# Patient Record
Sex: Male | Born: 1975 | Race: White | Hispanic: No | Marital: Married | Smoking: Never smoker
Health system: Southern US, Community
[De-identification: ages and names within clinical notes are randomized; demographics above are authoritative.]

## PROBLEM LIST (undated history)

## (undated) DIAGNOSIS — Z789 Other specified health status: Secondary | ICD-10-CM

## (undated) DIAGNOSIS — Z8489 Family history of other specified conditions: Secondary | ICD-10-CM

## (undated) HISTORY — PX: FOOT SURGERY: SHX648

---

## 2014-01-22 ENCOUNTER — Encounter (HOSPITAL_COMMUNITY): Payer: Self-pay | Admitting: Emergency Medicine

## 2014-01-22 ENCOUNTER — Inpatient Hospital Stay (HOSPITAL_COMMUNITY)
Admission: EM | Admit: 2014-01-22 | Discharge: 2014-01-28 | DRG: 516 | Disposition: A | Discharge status: Home / Self Care | Payer: 59 | Attending: Orthopedic Surgery | Admitting: Orthopedic Surgery

## 2014-01-22 ENCOUNTER — Observation Stay (HOSPITAL_COMMUNITY): Payer: 59

## 2014-01-22 ENCOUNTER — Emergency Department (HOSPITAL_COMMUNITY): Payer: 59

## 2014-01-22 DIAGNOSIS — S81809A Unspecified open wound, unspecified lower leg, initial encounter: Secondary | ICD-10-CM

## 2014-01-22 DIAGNOSIS — D72829 Elevated white blood cell count, unspecified: Secondary | ICD-10-CM | POA: Diagnosis present

## 2014-01-22 DIAGNOSIS — IMO0002 Reserved for concepts with insufficient information to code with codable children: Secondary | ICD-10-CM | POA: Diagnosis present

## 2014-01-22 DIAGNOSIS — S32409A Unspecified fracture of unspecified acetabulum, initial encounter for closed fracture: Principal | ICD-10-CM | POA: Diagnosis present

## 2014-01-22 DIAGNOSIS — S81009A Unspecified open wound, unspecified knee, initial encounter: Secondary | ICD-10-CM | POA: Diagnosis present

## 2014-01-22 DIAGNOSIS — K5900 Constipation, unspecified: Secondary | ICD-10-CM | POA: Diagnosis not present

## 2014-01-22 DIAGNOSIS — D62 Acute posthemorrhagic anemia: Secondary | ICD-10-CM | POA: Diagnosis not present

## 2014-01-22 DIAGNOSIS — S91009A Unspecified open wound, unspecified ankle, initial encounter: Secondary | ICD-10-CM

## 2014-01-22 DIAGNOSIS — S32402A Unspecified fracture of left acetabulum, initial encounter for closed fracture: Secondary | ICD-10-CM

## 2014-01-22 DIAGNOSIS — Z531 Procedure and treatment not carried out because of patient's decision for reasons of belief and group pressure: Secondary | ICD-10-CM

## 2014-01-22 HISTORY — DX: Other specified health status: Z78.9

## 2014-01-22 HISTORY — DX: Family history of other specified conditions: Z84.89

## 2014-01-22 LAB — COMPREHENSIVE METABOLIC PANEL
ALBUMIN: 4 g/dL (ref 3.5–5.2)
ALBUMIN: 4 g/dL (ref 3.5–5.2)
ALK PHOS: 47 U/L (ref 39–117)
ALT: 92 U/L — ABNORMAL HIGH (ref 0–53)
ALT: 86 U/L — ABNORMAL HIGH (ref 0–53)
AST: 111 U/L — ABNORMAL HIGH (ref 0–37)
AST: 99 U/L — AB (ref 0–37)
Alkaline Phosphatase: 49 U/L (ref 39–117)
BILIRUBIN TOTAL: 0.5 mg/dL (ref 0.3–1.2)
BUN: 12 mg/dL (ref 6–23)
BUN: 11 mg/dL (ref 6–23)
CHLORIDE: 101 meq/L (ref 96–112)
CO2: 24 mEq/L (ref 19–32)
CO2: 26 mEq/L (ref 19–32)
CREATININE: 0.78 mg/dL (ref 0.50–1.35)
Calcium: 9 mg/dL (ref 8.4–10.5)
Calcium: 8.8 mg/dL (ref 8.4–10.5)
Chloride: 99 mEq/L (ref 96–112)
Creatinine, Ser: 0.72 mg/dL (ref 0.50–1.35)
GFR calc Af Amer: >90 mL/min (ref >90)
GFR calc Af Amer: >90 mL/min (ref >90)
GFR calc non Af Amer: >90 mL/min (ref >90)
GFR calc non Af Amer: >90 mL/min (ref >90)
GLUCOSE: 115 mg/dL — AB (ref 70–99)
Glucose, Bld: 107 mg/dL — ABNORMAL HIGH (ref 70–99)
POTASSIUM: 3.5 meq/L — AB (ref 3.7–5.3)
Potassium: 3.3 mEq/L — ABNORMAL LOW (ref 3.7–5.3)
Sodium: 141 mEq/L (ref 137–147)
Sodium: 139 mEq/L (ref 137–147)
TOTAL PROTEIN: 6.9 g/dL (ref 6.0–8.3)
Total Bilirubin: 0.6 mg/dL (ref 0.3–1.2)
Total Protein: 7 g/dL (ref 6.0–8.3)

## 2014-01-22 LAB — CBC
HCT: 39.6 % (ref 39.0–52.0)
HEMOGLOBIN: 13.7 g/dL (ref 13.0–17.0)
MCH: 31.2 pg (ref 26.0–34.0)
MCHC: 34.6 g/dL (ref 30.0–36.0)
MCV: 90.2 fL (ref 78.0–100.0)
Platelets: 192 10*3/uL (ref 150–400)
RBC: 4.39 MIL/uL (ref 4.22–5.81)
RDW: 12.5 % (ref 11.5–15.5)
WBC: 20.5 10*3/uL — ABNORMAL HIGH (ref 4.0–10.5)

## 2014-01-22 LAB — CBC WITH DIFFERENTIAL/PLATELET
BASOS ABS: 0 10*3/uL (ref 0.0–0.1)
Basophils Relative: 0 % (ref 0–1)
Eosinophils Absolute: 0 10*3/uL (ref 0.0–0.7)
Eosinophils Relative: 0 % (ref 0–5)
HEMATOCRIT: 39.6 % (ref 39.0–52.0)
Hemoglobin: 14.1 g/dL (ref 13.0–17.0)
LYMPHS ABS: 1.8 10*3/uL (ref 0.7–4.0)
Lymphocytes Relative: 7 % — ABNORMAL LOW (ref 12–46)
MCH: 32 pg (ref 26.0–34.0)
MCHC: 35.6 g/dL (ref 30.0–36.0)
MCV: 89.8 fL (ref 78.0–100.0)
MONO ABS: 0.5 10*3/uL (ref 0.1–1.0)
Monocytes Relative: 2 % — ABNORMAL LOW (ref 3–12)
NEUTROS ABS: 22.9 10*3/uL — AB (ref 1.7–7.7)
Neutrophils Relative %: 91 % — ABNORMAL HIGH (ref 43–77)
Platelets: 216 10*3/uL (ref 150–400)
RBC: 4.41 MIL/uL (ref 4.22–5.81)
RDW: 12.6 % (ref 11.5–15.5)
WBC: 25.2 10*3/uL — AB (ref 4.0–10.5)

## 2014-01-22 MED ORDER — METHOCARBAMOL 500 MG PO TABS
500.0000 mg | ORAL_TABLET | Freq: Four times a day (QID) | ORAL | Status: DC | PRN
  Administered 2014-01-23: 500 mg via ORAL
  Filled 2014-01-22: qty 1

## 2014-01-22 MED ORDER — METHOCARBAMOL 1000 MG/10ML IJ SOLN
500.0000 mg | Freq: Four times a day (QID) | INTRAVENOUS | Status: DC | PRN
  Filled 2014-01-22: qty 5

## 2014-01-22 MED ORDER — IOHEXOL 300 MG/ML  SOLN
100.0000 mL | Freq: Once | INTRAMUSCULAR | Status: AC | PRN
  Administered 2014-01-22: 100 mL via INTRAVENOUS

## 2014-01-22 MED ORDER — SODIUM CHLORIDE 0.9 % IV SOLN
INTRAVENOUS | Status: DC
  Administered 2014-01-23: via INTRAVENOUS

## 2014-01-22 MED ORDER — ONDANSETRON HCL 4 MG/2ML IJ SOLN
4.0000 mg | Freq: Once | INTRAMUSCULAR | Status: AC
Start: 2014-01-22 — End: 2014-01-22
  Administered 2014-01-22: 4 mg via INTRAVENOUS
  Filled 2014-01-22: qty 2

## 2014-01-22 MED ORDER — POLYETHYLENE GLYCOL 3350 17 G PO PACK
17.0000 g | PACK | Freq: Every day | ORAL | Status: DC | PRN
  Filled 2014-01-22: qty 1

## 2014-01-22 MED ORDER — OXYCODONE HCL 5 MG PO TABS
5.0000 mg | ORAL_TABLET | ORAL | Status: DC | PRN
  Administered 2014-01-23 – 2014-01-24 (×8): 10 mg via ORAL
  Filled 2014-01-22 (×8): qty 2

## 2014-01-22 MED ORDER — FENTANYL CITRATE 0.05 MG/ML IJ SOLN
100.0000 ug | Freq: Once | INTRAMUSCULAR | Status: AC
  Administered 2014-01-22: 100 ug via INTRAVENOUS
  Filled 2014-01-22: qty 2

## 2014-01-22 MED ORDER — HYDROMORPHONE HCL PF 1 MG/ML IJ SOLN
1.0000 mg | INTRAMUSCULAR | Status: DC | PRN
  Administered 2014-01-22 – 2014-01-24 (×5): 1 mg via INTRAVENOUS
  Filled 2014-01-22 (×6): qty 1

## 2014-01-22 MED ORDER — BISACODYL 10 MG RE SUPP: 10.0000 mg | Freq: Every day | RECTAL | Status: DC | PRN

## 2014-01-22 MED ORDER — HYDROMORPHONE HCL PF 1 MG/ML IJ SOLN
1.0000 mg | INTRAMUSCULAR | Status: AC | PRN
  Administered 2014-01-22 (×3): 1 mg via INTRAVENOUS
  Filled 2014-01-22 (×3): qty 1

## 2014-01-22 NOTE — ED Notes (Signed)
Byerly at bedside.
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
## 2014-01-22 NOTE — ED Notes (Signed)
Pt transported to Xray/CT 
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
## 2014-01-22 NOTE — ED Notes (Signed)
Removal of C-collar by Dr. Effie ShyWentz

## 2014-01-22 NOTE — ED Notes (Signed)
Pt back from Xray/CT
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
## 2014-01-22 NOTE — ED Notes (Signed)
Pt transported to Xray. 
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
## 2014-01-23 ENCOUNTER — Observation Stay (HOSPITAL_COMMUNITY): Payer: 59

## 2014-01-23 ENCOUNTER — Encounter (HOSPITAL_COMMUNITY): Payer: Self-pay | Admitting: General Practice

## 2014-01-23 DIAGNOSIS — S32409A Unspecified fracture of unspecified acetabulum, initial encounter for closed fracture: Secondary | ICD-10-CM | POA: Diagnosis present

## 2014-01-23 LAB — URINALYSIS, ROUTINE W REFLEX MICROSCOPIC
Bilirubin Urine: NEGATIVE
Glucose, UA: NEGATIVE mg/dL
Ketones, ur: NEGATIVE mg/dL
LEUKOCYTES UA: NEGATIVE
Nitrite: NEGATIVE
PROTEIN: 30 mg/dL — AB
Specific Gravity, Urine: 1.033 — ABNORMAL HIGH (ref 1.005–1.030)
Urobilinogen, UA: 1 mg/dL (ref 0.0–1.0)
pH: 7 (ref 5.0–8.0)

## 2014-01-23 LAB — URINE MICROSCOPIC-ADD ON

## 2014-01-23 LAB — COMPREHENSIVE METABOLIC PANEL
ALK PHOS: 45 U/L (ref 39–117)
ALT: 69 U/L — AB (ref 0–53)
AST: 63 U/L — ABNORMAL HIGH (ref 0–37)
Albumin: 3.7 g/dL (ref 3.5–5.2)
BILIRUBIN TOTAL: 0.8 mg/dL (ref 0.3–1.2)
BUN: 7 mg/dL (ref 6–23)
CO2: 29 meq/L (ref 19–32)
Calcium: 8.7 mg/dL (ref 8.4–10.5)
Chloride: 102 mEq/L (ref 96–112)
Creatinine, Ser: 0.74 mg/dL (ref 0.50–1.35)
GFR calc non Af Amer: >90 mL/min (ref >90)
Glucose, Bld: 86 mg/dL (ref 70–99)
POTASSIUM: 3.5 meq/L — AB (ref 3.7–5.3)
SODIUM: 141 meq/L (ref 137–147)
TOTAL PROTEIN: 6.6 g/dL (ref 6.0–8.3)

## 2014-01-23 LAB — MRSA PCR SCREENING: MRSA BY PCR: NEGATIVE

## 2014-01-23 MED ORDER — METHOCARBAMOL 500 MG PO TABS
500.0000 mg | ORAL_TABLET | Freq: Four times a day (QID) | ORAL | Status: DC | PRN
  Administered 2014-01-23 – 2014-01-26 (×7): 1000 mg via ORAL
  Filled 2014-01-23 (×7): qty 2

## 2014-01-23 MED ORDER — METHOCARBAMOL 1000 MG/10ML IJ SOLN
500.0000 mg | Freq: Four times a day (QID) | INTRAVENOUS | Status: DC | PRN
  Filled 2014-01-23: qty 10

## 2014-01-23 MED ORDER — DOCUSATE SODIUM 100 MG PO CAPS
100.0000 mg | ORAL_CAPSULE | Freq: Two times a day (BID) | ORAL | Status: DC
  Administered 2014-01-23 (×2): 100 mg via ORAL
  Filled 2014-01-23 (×3): qty 1

## 2014-01-23 MED ORDER — ACETAMINOPHEN 500 MG PO TABS
1000.0000 mg | ORAL_TABLET | Freq: Three times a day (TID) | ORAL | Status: DC
  Administered 2014-01-23 – 2014-01-24 (×4): 1000 mg via ORAL
  Filled 2014-01-23 (×6): qty 2

## 2014-01-23 MED ORDER — CEFAZOLIN SODIUM-DEXTROSE 2-3 GM-% IV SOLR
2.0000 g | Freq: Once | INTRAVENOUS | Status: AC
  Administered 2014-01-24: 2 g via INTRAVENOUS
  Filled 2014-01-23: qty 50

## 2014-01-23 NOTE — Progress Notes (Signed)
UR Completed Brenda Graves-Bigelow, RN,BSN [phone]  
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
## 2014-01-23 NOTE — Progress Notes (Signed)
Orthopedic Tech Progress Note Patient Details:  Donald MorganDevon Badders 1976-02-28 [id number]  Patient ID: Donald Morganevon Birkland, male   DOB: 1976-02-28, 38 y.o.   MRN: [id number]   [name], [name] 01/23/2014, 2:39 PMTrapeze bar
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
## 2014-01-24 ENCOUNTER — Encounter (HOSPITAL_COMMUNITY): Payer: Self-pay | Admitting: Anesthesiology

## 2014-01-24 ENCOUNTER — Inpatient Hospital Stay (HOSPITAL_COMMUNITY): Payer: 59 | Admitting: Anesthesiology

## 2014-01-24 ENCOUNTER — Encounter (HOSPITAL_COMMUNITY)
Admission: EM | Disposition: A | Discharge status: Home / Self Care | Payer: 59 | Source: Home / Self Care | Attending: Orthopedic Surgery

## 2014-01-24 ENCOUNTER — Inpatient Hospital Stay (HOSPITAL_COMMUNITY): Payer: 59

## 2014-01-24 ENCOUNTER — Encounter (HOSPITAL_COMMUNITY): Payer: 59 | Admitting: Anesthesiology

## 2014-01-24 HISTORY — PX: ORIF ACETABULAR FRACTURE: SHX5029

## 2014-01-24 LAB — CBC
HCT: 36.5 % — ABNORMAL LOW (ref 39.0–52.0)
HEMATOCRIT: 39.4 % (ref 39.0–52.0)
Hemoglobin: 13.2 g/dL (ref 13.0–17.0)
Hemoglobin: 12.3 g/dL — ABNORMAL LOW (ref 13.0–17.0)
MCH: 31.3 pg (ref 26.0–34.0)
MCH: 30.9 pg (ref 26.0–34.0)
MCHC: 33.5 g/dL (ref 30.0–36.0)
MCHC: 33.7 g/dL (ref 30.0–36.0)
MCV: 93.4 fL (ref 78.0–100.0)
MCV: 91.7 fL (ref 78.0–100.0)
PLATELETS: 164 10*3/uL (ref 150–400)
Platelets: 180 10*3/uL (ref 150–400)
RBC: 4.22 MIL/uL (ref 4.22–5.81)
RBC: 3.98 MIL/uL — ABNORMAL LOW (ref 4.22–5.81)
RDW: 12.7 % (ref 11.5–15.5)
RDW: 12.6 % (ref 11.5–15.5)
WBC: 8.9 10*3/uL (ref 4.0–10.5)
WBC: 12.5 10*3/uL — ABNORMAL HIGH (ref 4.0–10.5)

## 2014-01-24 LAB — URINE CULTURE
Colony Count: NO GROWTH
Culture: NO GROWTH

## 2014-01-24 LAB — CREATININE, SERUM
CREATININE: 0.73 mg/dL (ref 0.50–1.35)
GFR calc non Af Amer: >90 mL/min (ref >90)

## 2014-01-24 SURGERY — OPEN REDUCTION INTERNAL FIXATION (ORIF) ACETABULAR FRACTURE
Anesthesia: General | Laterality: Left

## 2014-01-24 MED ORDER — GLYCOPYRROLATE 0.2 MG/ML IJ SOLN
INTRAMUSCULAR | Status: DC | PRN
  Administered 2014-01-24: 0.6 mg via INTRAVENOUS
  Administered 2014-01-24: 0.2 mg via INTRAVENOUS

## 2014-01-24 MED ORDER — PHENYLEPHRINE 40 MCG/ML (10ML) SYRINGE FOR IV PUSH (FOR BLOOD PRESSURE SUPPORT)
PREFILLED_SYRINGE | INTRAVENOUS | Status: AC
  Filled 2014-01-24: qty 10

## 2014-01-24 MED ORDER — ROCURONIUM BROMIDE 100 MG/10ML IV SOLN
INTRAVENOUS | Status: DC | PRN
  Administered 2014-01-24: 10 mg via INTRAVENOUS
  Administered 2014-01-24: 40 mg via INTRAVENOUS

## 2014-01-24 MED ORDER — ONDANSETRON HCL 4 MG/2ML IJ SOLN: 4.0000 mg | Freq: Four times a day (QID) | INTRAMUSCULAR | Status: DC | PRN

## 2014-01-24 MED ORDER — OXYCODONE HCL 5 MG PO TABS
5.0000 mg | ORAL_TABLET | ORAL | Status: DC | PRN
  Administered 2014-01-26 – 2014-01-27 (×4): 10 mg via ORAL
  Filled 2014-01-24 (×5): qty 2

## 2014-01-24 MED ORDER — ONDANSETRON HCL 4 MG/2ML IJ SOLN
4.0000 mg | Freq: Four times a day (QID) | INTRAMUSCULAR | Status: DC | PRN
  Administered 2014-01-24: 4 mg via INTRAVENOUS
  Filled 2014-01-24: qty 2

## 2014-01-24 MED ORDER — 0.9 % SODIUM CHLORIDE (POUR BTL) OPTIME
TOPICAL | Status: DC | PRN
  Administered 2014-01-24: 1000 mL

## 2014-01-24 MED ORDER — ACETAMINOPHEN 500 MG PO TABS
1000.0000 mg | ORAL_TABLET | Freq: Three times a day (TID) | ORAL | Status: DC
  Administered 2014-01-26 – 2014-01-27 (×5): 1000 mg via ORAL
  Filled 2014-01-24 (×12): qty 2

## 2014-01-24 MED ORDER — POTASSIUM CHLORIDE IN NACL 20-0.9 MEQ/L-% IV SOLN
INTRAVENOUS | Status: DC
  Administered 2014-01-24 – 2014-01-26 (×2): via INTRAVENOUS
  Filled 2014-01-24 (×8): qty 1000

## 2014-01-24 MED ORDER — FENTANYL CITRATE 0.05 MG/ML IJ SOLN
INTRAMUSCULAR | Status: DC | PRN
  Administered 2014-01-24: 150 ug via INTRAVENOUS
  Administered 2014-01-24 (×5): 50 ug via INTRAVENOUS
  Administered 2014-01-24: 100 ug via INTRAVENOUS

## 2014-01-24 MED ORDER — CEFAZOLIN SODIUM 1-5 GM-% IV SOLN
1.0000 g | Freq: Four times a day (QID) | INTRAVENOUS | Status: AC
  Administered 2014-01-24 – 2014-01-25 (×3): 1 g via INTRAVENOUS
  Filled 2014-01-24 (×3): qty 50

## 2014-01-24 MED ORDER — VECURONIUM BROMIDE 10 MG IV SOLR
INTRAVENOUS | Status: AC
  Filled 2014-01-24: qty 10

## 2014-01-24 MED ORDER — NALOXONE HCL 0.4 MG/ML IJ SOLN: 0.4000 mg | INTRAMUSCULAR | Status: DC | PRN

## 2014-01-24 MED ORDER — SUCCINYLCHOLINE CHLORIDE 20 MG/ML IJ SOLN
INTRAMUSCULAR | Status: DC | PRN
  Administered 2014-01-24: 100 mg via INTRAVENOUS

## 2014-01-24 MED ORDER — EPHEDRINE SULFATE 50 MG/ML IJ SOLN
INTRAMUSCULAR | Status: AC
  Filled 2014-01-24: qty 1

## 2014-01-24 MED ORDER — MIDAZOLAM HCL 2 MG/2ML IJ SOLN
INTRAMUSCULAR | Status: AC
  Filled 2014-01-24: qty 2

## 2014-01-24 MED ORDER — PROPOFOL 10 MG/ML IV BOLUS
INTRAVENOUS | Status: AC
Start: 2014-01-24 — End: 2014-01-24
  Filled 2014-01-24: qty 20

## 2014-01-24 MED ORDER — HYDROMORPHONE 0.3 MG/ML IV SOLN
INTRAVENOUS | Status: DC
  Administered 2014-01-24: 9.7 mg via INTRAVENOUS
  Administered 2014-01-24: 18:00:00 via INTRAVENOUS
  Administered 2014-01-25: 2.1 mg via INTRAVENOUS
  Administered 2014-01-25: 0.9 mg via INTRAVENOUS
  Administered 2014-01-25 (×2): via INTRAVENOUS
  Administered 2014-01-25: 5.7 mg via INTRAVENOUS
  Administered 2014-01-25 (×2): 1.2 mg via INTRAVENOUS
  Administered 2014-01-26: 4.8 mg via INTRAVENOUS
  Administered 2014-01-26: 0.6 mg via INTRAVENOUS
  Filled 2014-01-24 (×2): qty 25

## 2014-01-24 MED ORDER — SUCCINYLCHOLINE CHLORIDE 20 MG/ML IJ SOLN
INTRAMUSCULAR | Status: AC
  Filled 2014-01-24: qty 1

## 2014-01-24 MED ORDER — PROPOFOL 10 MG/ML IV BOLUS
INTRAVENOUS | Status: DC | PRN
  Administered 2014-01-24: 130 mg via INTRAVENOUS

## 2014-01-24 MED ORDER — SODIUM CHLORIDE 0.9 % IJ SOLN: 9.0000 mL | INTRAMUSCULAR | Status: DC | PRN

## 2014-01-24 MED ORDER — GLYCOPYRROLATE 0.2 MG/ML IJ SOLN
INTRAMUSCULAR | Status: AC
  Filled 2014-01-24: qty 2

## 2014-01-24 MED ORDER — NEOSTIGMINE METHYLSULFATE 10 MG/10ML IV SOLN
INTRAVENOUS | Status: DC | PRN
  Administered 2014-01-24: 3 mg via INTRAVENOUS
  Administered 2014-01-24: 1 mg via INTRAVENOUS

## 2014-01-24 MED ORDER — HYDROMORPHONE 0.3 MG/ML IV SOLN
INTRAVENOUS | Status: AC
  Filled 2014-01-24: qty 25

## 2014-01-24 MED ORDER — HYDROMORPHONE HCL PF 1 MG/ML IJ SOLN
INTRAMUSCULAR | Status: AC
  Filled 2014-01-24: qty 2

## 2014-01-24 MED ORDER — POLYETHYLENE GLYCOL 3350 17 G PO PACK
17.0000 g | PACK | Freq: Every day | ORAL | Status: DC
  Administered 2014-01-25 – 2014-01-28 (×4): 17 g via ORAL
  Filled 2014-01-24 (×5): qty 1

## 2014-01-24 MED ORDER — MIDAZOLAM HCL 5 MG/5ML IJ SOLN
INTRAMUSCULAR | Status: DC | PRN
  Administered 2014-01-24: 2 mg via INTRAVENOUS

## 2014-01-24 MED ORDER — DOCUSATE SODIUM 100 MG PO CAPS
100.0000 mg | ORAL_CAPSULE | Freq: Two times a day (BID) | ORAL | Status: DC
  Administered 2014-01-24 – 2014-01-28 (×8): 100 mg via ORAL
  Filled 2014-01-24 (×12): qty 1

## 2014-01-24 MED ORDER — ROCURONIUM BROMIDE 50 MG/5ML IV SOLN
INTRAVENOUS | Status: AC
  Filled 2014-01-24: qty 1

## 2014-01-24 MED ORDER — FENTANYL CITRATE 0.05 MG/ML IJ SOLN
INTRAMUSCULAR | Status: AC
  Filled 2014-01-24: qty 5

## 2014-01-24 MED ORDER — HYDROMORPHONE HCL PF 1 MG/ML IJ SOLN
INTRAMUSCULAR | Status: AC
  Filled 2014-01-24: qty 1

## 2014-01-24 MED ORDER — ARTIFICIAL TEARS OP OINT
TOPICAL_OINTMENT | OPHTHALMIC | Status: AC
  Filled 2014-01-24: qty 3.5

## 2014-01-24 MED ORDER — DIPHENHYDRAMINE HCL 12.5 MG/5ML PO ELIX: 12.5000 mg | ORAL_SOLUTION | Freq: Four times a day (QID) | ORAL | Status: DC | PRN

## 2014-01-24 MED ORDER — OXYCODONE HCL 5 MG PO TABS
5.0000 mg | ORAL_TABLET | Freq: Once | ORAL | Status: AC | PRN
  Administered 2014-01-24: 5 mg via ORAL

## 2014-01-24 MED ORDER — METOCLOPRAMIDE HCL 5 MG/ML IJ SOLN
INTRAMUSCULAR | Status: AC
Start: 2014-01-24 — End: 2014-01-25
  Filled 2014-01-24: qty 2

## 2014-01-24 MED ORDER — LIDOCAINE HCL (CARDIAC) 20 MG/ML IV SOLN
INTRAVENOUS | Status: DC | PRN
  Administered 2014-01-24: 60 mg via INTRAVENOUS

## 2014-01-24 MED ORDER — OXYCODONE HCL 5 MG/5ML PO SOLN: 5.0000 mg | Freq: Once | ORAL | Status: AC | PRN

## 2014-01-24 MED ORDER — VECURONIUM BROMIDE 10 MG IV SOLR
50.0000 mg | INTRAVENOUS | Status: DC | PRN
  Administered 2014-01-24: 0.1 mg/kg/h via INTRAVENOUS

## 2014-01-24 MED ORDER — LACTATED RINGERS IV SOLN: INTRAVENOUS | Status: DC

## 2014-01-24 MED ORDER — ACETAMINOPHEN 10 MG/ML IV SOLN
1000.0000 mg | Freq: Four times a day (QID) | INTRAVENOUS | Status: AC
  Administered 2014-01-24 – 2014-01-25 (×4): 1000 mg via INTRAVENOUS
  Filled 2014-01-24 (×4): qty 100

## 2014-01-24 MED ORDER — METOCLOPRAMIDE HCL 5 MG/ML IJ SOLN
10.0000 mg | Freq: Once | INTRAMUSCULAR | Status: AC | PRN
  Administered 2014-01-24: 10 mg via INTRAVENOUS

## 2014-01-24 MED ORDER — HYDROMORPHONE HCL PF 1 MG/ML IJ SOLN
0.2500 mg | INTRAMUSCULAR | Status: DC | PRN
  Administered 2014-01-24 (×4): 0.5 mg via INTRAVENOUS

## 2014-01-24 MED ORDER — ONDANSETRON HCL 4 MG/2ML IJ SOLN
INTRAMUSCULAR | Status: AC
  Filled 2014-01-24: qty 2

## 2014-01-24 MED ORDER — SORBITOL 70 % SOLN: 30.0000 mL | Freq: Every day | Status: DC | PRN

## 2014-01-24 MED ORDER — LACTATED RINGERS IV SOLN
INTRAVENOUS | Status: DC | PRN
  Administered 2014-01-24 (×4): via INTRAVENOUS

## 2014-01-24 MED ORDER — VECURONIUM BROMIDE 10 MG IV SOLR
INTRAVENOUS | Status: DC | PRN
  Administered 2014-01-24: 4 mg via INTRAVENOUS
  Administered 2014-01-24: 1 mg via INTRAVENOUS

## 2014-01-24 MED ORDER — METOCLOPRAMIDE HCL 10 MG PO TABS: 5.0000 mg | ORAL_TABLET | Freq: Three times a day (TID) | ORAL | Status: DC | PRN

## 2014-01-24 MED ORDER — ENOXAPARIN SODIUM 40 MG/0.4ML ~~LOC~~ SOLN
40.0000 mg | SUBCUTANEOUS | Status: DC
  Administered 2014-01-25 – 2014-01-28 (×4): 40 mg via SUBCUTANEOUS
  Filled 2014-01-24 (×5): qty 0.4

## 2014-01-24 MED ORDER — PROPOFOL 10 MG/ML IV BOLUS
INTRAVENOUS | Status: AC
  Filled 2014-01-24: qty 20

## 2014-01-24 MED ORDER — METOCLOPRAMIDE HCL 5 MG/ML IJ SOLN: 5.0000 mg | Freq: Three times a day (TID) | INTRAMUSCULAR | Status: DC | PRN

## 2014-01-24 MED ORDER — NEOSTIGMINE METHYLSULFATE 10 MG/10ML IV SOLN
INTRAVENOUS | Status: AC
  Filled 2014-01-24: qty 1

## 2014-01-24 MED ORDER — OXYCODONE HCL 5 MG PO TABS
ORAL_TABLET | ORAL | Status: AC
  Filled 2014-01-24: qty 1

## 2014-01-24 MED ORDER — LACTATED RINGERS IV SOLN
INTRAVENOUS | Status: DC | PRN
  Administered 2014-01-24: 14:00:00 via INTRAVENOUS

## 2014-01-24 MED ORDER — MAGNESIUM CITRATE PO SOLN: 1.0000 | Freq: Once | ORAL | Status: AC | PRN

## 2014-01-24 MED ORDER — HYDROMORPHONE HCL PF 1 MG/ML IJ SOLN
0.5000 mg | INTRAMUSCULAR | Status: DC | PRN
  Administered 2014-01-26 (×2): 0.5 mg via INTRAVENOUS
  Filled 2014-01-24 (×2): qty 1

## 2014-01-24 MED ORDER — DIPHENHYDRAMINE HCL 12.5 MG/5ML PO ELIX
12.5000 mg | ORAL_SOLUTION | ORAL | Status: DC | PRN
  Administered 2014-01-24 – 2014-01-26 (×2): 25 mg via ORAL
  Filled 2014-01-24 (×2): qty 10

## 2014-01-24 MED ORDER — GLYCOPYRROLATE 0.2 MG/ML IJ SOLN
INTRAMUSCULAR | Status: AC
  Filled 2014-01-24: qty 3

## 2014-01-24 MED ORDER — DIPHENHYDRAMINE HCL 50 MG/ML IJ SOLN: 12.5000 mg | Freq: Four times a day (QID) | INTRAMUSCULAR | Status: DC | PRN

## 2014-01-24 MED ORDER — ONDANSETRON HCL 4 MG PO TABS: 4.0000 mg | ORAL_TABLET | Freq: Four times a day (QID) | ORAL | Status: DC | PRN

## 2014-01-24 SURGICAL SUPPLY — 84 items
BIT DRILL AO MATTA 2.5MX230M (BIT) ×1 IMPLANT
BLADE SURG ROTATE 9660 (MISCELLANEOUS) ×3 IMPLANT
BRUSH SCRUB DISP (MISCELLANEOUS) ×6 IMPLANT
CLOSURE WOUND 1/2 X4 (GAUZE/BANDAGES/DRESSINGS) ×1
COVER SURGICAL LIGHT HANDLE (MISCELLANEOUS) ×3 IMPLANT
DRAIN CHANNEL 10F 3/8 F FF (DRAIN) IMPLANT
DRAIN CHANNEL 15F RND FF W/TCR (WOUND CARE) IMPLANT
DRAPE C-ARM 42X72 X-RAY (DRAPES) ×3 IMPLANT
DRAPE C-ARMOR (DRAPES) ×3 IMPLANT
DRAPE INCISE IOBAN 66X45 STRL (DRAPES) ×3 IMPLANT
DRAPE INCISE IOBAN 85X60 (DRAPES) ×6 IMPLANT
DRAPE ORTHO SPLIT 77X108 STRL (DRAPES) ×4
DRAPE SURG ORHT 6 SPLT 77X108 (DRAPES) ×2 IMPLANT
DRAPE U-SHAPE 47X51 STRL (DRAPES) ×3 IMPLANT
DRILL BIT AO MATTA 2.5MX230M (BIT) ×3
DRSG ADAPTIC 3X8 NADH LF (GAUZE/BANDAGES/DRESSINGS) ×3 IMPLANT
DRSG MEPILEX BORDER 4X12 (GAUZE/BANDAGES/DRESSINGS) ×3 IMPLANT
DRSG PAD ABDOMINAL 8X10 ST (GAUZE/BANDAGES/DRESSINGS) ×6 IMPLANT
ELECT BLADE 6.5 EXT (BLADE) ×3 IMPLANT
ELECT REM PT RETURN 9FT ADLT (ELECTROSURGICAL) ×3
ELECTRODE REM PT RTRN 9FT ADLT (ELECTROSURGICAL) ×1 IMPLANT
EVACUATOR 1/8 PVC DRAIN (DRAIN) IMPLANT
EVACUATOR SILICONE 100CC (DRAIN) ×3 IMPLANT
GAUZE SPONGE 4X4 16PLY XRAY LF (GAUZE/BANDAGES/DRESSINGS) ×3 IMPLANT
GLOVE BIO SURGEON STRL SZ 6.5 (GLOVE) ×4 IMPLANT
GLOVE BIO SURGEON STRL SZ7.5 (GLOVE) ×3 IMPLANT
GLOVE BIO SURGEON STRL SZ8 (GLOVE) ×3 IMPLANT
GLOVE BIO SURGEONS STRL SZ 6.5 (GLOVE) ×2
GLOVE BIOGEL PI IND STRL 6.5 (GLOVE) ×2 IMPLANT
GLOVE BIOGEL PI IND STRL 7.0 (GLOVE) ×2 IMPLANT
GLOVE BIOGEL PI IND STRL 7.5 (GLOVE) ×2 IMPLANT
GLOVE BIOGEL PI IND STRL 8 (GLOVE) ×1 IMPLANT
GLOVE BIOGEL PI INDICATOR 6.5 (GLOVE) ×4
GLOVE BIOGEL PI INDICATOR 7.0 (GLOVE) ×4
GLOVE BIOGEL PI INDICATOR 7.5 (GLOVE) ×4
GLOVE BIOGEL PI INDICATOR 8 (GLOVE) ×2
GOWN STRL REUS W/ TWL LRG LVL3 (GOWN DISPOSABLE) ×2 IMPLANT
GOWN STRL REUS W/ TWL XL LVL3 (GOWN DISPOSABLE) ×1 IMPLANT
GOWN STRL REUS W/TWL 2XL LVL3 (GOWN DISPOSABLE) ×3 IMPLANT
GOWN STRL REUS W/TWL LRG LVL3 (GOWN DISPOSABLE) ×4
GOWN STRL REUS W/TWL XL LVL3 (GOWN DISPOSABLE) ×2
KIT BASIN OR (CUSTOM PROCEDURE TRAY) ×3 IMPLANT
KIT ROOM TURNOVER OR (KITS) ×3 IMPLANT
LOOP VESSEL MAXI BLUE (MISCELLANEOUS) IMPLANT
MANIFOLD NEPTUNE II (INSTRUMENTS) ×3 IMPLANT
MARKER SKIN DUAL TIP RULER LAB (MISCELLANEOUS) ×3 IMPLANT
NEEDLE MAYO TROCAR (NEEDLE) ×3 IMPLANT
NS IRRIG 1000ML POUR BTL (IV SOLUTION) ×3 IMPLANT
PACK TOTAL JOINT (CUSTOM PROCEDURE TRAY) ×3 IMPLANT
PAD ARMBOARD 7.5X6 YLW CONV (MISCELLANEOUS) ×6 IMPLANT
PLATE ACET STRT 70.5M 6H (Plate) ×3 IMPLANT
PLATE ACET STRT 94.5M 8H (Plate) ×3 IMPLANT
RETRIEVER SUT HEWSON (MISCELLANEOUS) ×3 IMPLANT
SCREW CORT 48X3.5XST NS LF (Screw) ×1 IMPLANT
SCREW CORTEX ST MATTA 3.5X20 (Screw) ×6 IMPLANT
SCREW CORTEX ST MATTA 3.5X22MM (Screw) ×3 IMPLANT
SCREW CORTEX ST MATTA 3.5X24 (Screw) ×3 IMPLANT
SCREW CORTEX ST MATTA 3.5X30MM (Screw) ×3 IMPLANT
SCREW CORTEX ST MATTA 3.5X34MM (Screw) ×3 IMPLANT
SCREW CORTEX ST MATTA 3.5X38M (Screw) ×3 IMPLANT
SCREW CORTICAL 3.5X42MM (Screw) ×3 IMPLANT
SCREW CORTICAL 3.5X48MM (Screw) ×2 IMPLANT
SPONGE GAUZE 4X4 12PLY (GAUZE/BANDAGES/DRESSINGS) ×3 IMPLANT
SPONGE LAP 18X18 X RAY DECT (DISPOSABLE) ×9 IMPLANT
STAPLER VISISTAT 35W (STAPLE) ×3 IMPLANT
STRIP CLOSURE SKIN 1/2X4 (GAUZE/BANDAGES/DRESSINGS) ×2 IMPLANT
SUCTION FRAZIER TIP 10 FR DISP (SUCTIONS) ×3 IMPLANT
SUT ETHILON 3 0 PS 1 (SUTURE) ×3 IMPLANT
SUT FIBERWIRE #2 38 T-5 BLUE (SUTURE) ×6
SUT FIBERWIRE 2-0 18 17.9 3/8 (SUTURE) ×3
SUT VIC AB 0 CT1 27 (SUTURE) ×2
SUT VIC AB 0 CT1 27XBRD ANBCTR (SUTURE) ×1 IMPLANT
SUT VIC AB 1 CT1 18XCR BRD 8 (SUTURE) ×1 IMPLANT
SUT VIC AB 1 CT1 8-18 (SUTURE) ×2
SUT VIC AB 2-0 CT1 27 (SUTURE) ×2
SUT VIC AB 2-0 CT1 TAPERPNT 27 (SUTURE) ×1 IMPLANT
SUT VIC AB 2-0 SH 27 (SUTURE) ×2
SUT VIC AB 2-0 SH 27X BRD (SUTURE) ×1 IMPLANT
SUTURE FIBERWR #2 38 T-5 BLUE (SUTURE) ×2 IMPLANT
SUTURE FIBERWR 2-0 18 17.9 3/8 (SUTURE) ×1 IMPLANT
TOWEL OR 17X24 6PK STRL BLUE (TOWEL DISPOSABLE) ×3 IMPLANT
TOWEL OR 17X26 10 PK STRL BLUE (TOWEL DISPOSABLE) ×6 IMPLANT
TRAY FOLEY CATH 16FRSI W/METER (SET/KITS/TRAYS/PACK) ×3 IMPLANT
WATER STERILE IRR 1000ML POUR (IV SOLUTION) IMPLANT

## 2014-01-24 NOTE — Progress Notes (Signed)
Agree Burke Thompson, MD, MPH, FACS Trauma: [phone] General Surgery: [phone]  

## 2014-01-24 NOTE — Anesthesia Preprocedure Evaluation (Addendum)
Anesthesia Evaluation  Patient identified by MRN, date of birth, ID band Patient awake    Reviewed: Allergy & Precautions, H&P , NPO status , Patient's Chart, lab work & pertinent test results, reviewed documented beta blocker date and time   History of Anesthesia Complications (+) Family history of anesthesia reactionHistory of anesthetic complications: Uncle had MH.  Airway Mallampati: II TM Distance: >3 FB Neck ROM: full    Dental  (+) Teeth Intact, Dental Advisory Given   Pulmonary neg pulmonary ROS,  breath sounds clear to auscultation        Cardiovascular negative cardio ROS  Rhythm:regular     Neuro/Psych negative neurological ROS  negative psych ROS   GI/Hepatic negative GI ROS, Neg liver ROS,   Endo/Other  negative endocrine ROS  Renal/GU negative Renal ROS  negative genitourinary   Musculoskeletal   Abdominal   Peds  Hematology  (+) REFUSES BLOOD PRODUCTS, JEHOVAH'S WITNESS  Anesthesia Other Findings See surgeon's H&P   Reproductive/Obstetrics negative OB ROS                          Anesthesia Physical Anesthesia Plan  ASA: II  Anesthesia Plan: General   Post-op Pain Management:    Induction: Intravenous  Airway Management Planned: Oral ETT  Additional Equipment:   Intra-op Plan:   Post-operative Plan: Extubation in OR  Informed Consent: I have reviewed the patients History and Physical, chart, labs and discussed the procedure including the risks, benefits and alternatives for the proposed anesthesia with the patient or authorized representative who has indicated his/her understanding and acceptance.   Dental Advisory Given  Plan Discussed with: CRNA and Surgeon  Anesthesia Plan Comments:         Anesthesia Quick Evaluation

## 2014-01-24 NOTE — Anesthesia Procedure Notes (Signed)
Procedure Name: Intubation Date/Time: 01/24/2014 1:53 PM Performed by: Orvilla FusATO, SARAH A Pre-anesthesia Checklist: Patient identified, Timeout performed, Emergency Drugs available, Suction available and Patient being monitored Patient Re-evaluated:Patient Re-evaluated prior to inductionOxygen Delivery Method: Circle system utilized Preoxygenation: Pre-oxygenation with 100% oxygen Intubation Type: IV induction Ventilation: Mask ventilation without difficulty Laryngoscope Size: Mac and 3 Grade View: Grade I Tube type: Oral Tube size: 7.5 mm Number of attempts: 1 (DL by paramedic student Roseanne RenoStewart) Airway Equipment and Method: Stylet Placement Confirmation: ETT inserted through vocal cords under direct vision,  breath sounds checked- equal and bilateral and positive ETCO2 Secured at: 23 cm Tube secured with: Tape Dental Injury: Teeth and Oropharynx as per pre-operative assessment
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
## 2014-01-24 NOTE — Consult Note (Signed)
I have seen and examined the patient. I agree with the findings above.  I discussed with the patient the risks and benefits of surgery for left acetabulum repair, including the possibility of infection, nerve injury, vessel injury, wound breakdown, avascular necrosis, arthritis, symptomatic hardware, DVT/ PE, loss of motion, and need for further surgery among others.  He understood these risks and wished to proceed.   Budd PalmerHANDY,MICHAEL H, MD 01/24/2014 12:53 PM

## 2014-01-25 LAB — CBC
HEMATOCRIT: 38.4 % — AB (ref 39.0–52.0)
Hemoglobin: 12.7 g/dL — ABNORMAL LOW (ref 13.0–17.0)
MCH: 30.5 pg (ref 26.0–34.0)
MCHC: 33.1 g/dL (ref 30.0–36.0)
MCV: 92.3 fL (ref 78.0–100.0)
Platelets: 169 10*3/uL (ref 150–400)
RBC: 4.16 MIL/uL — ABNORMAL LOW (ref 4.22–5.81)
RDW: 12.5 % (ref 11.5–15.5)
WBC: 9.4 10*3/uL (ref 4.0–10.5)

## 2014-01-25 LAB — BASIC METABOLIC PANEL
BUN: 6 mg/dL (ref 6–23)
CALCIUM: 8.3 mg/dL — AB (ref 8.4–10.5)
CO2: 30 meq/L (ref 19–32)
CREATININE: 0.73 mg/dL (ref 0.50–1.35)
Chloride: 102 mEq/L (ref 96–112)
GFR calc Af Amer: >90 mL/min (ref >90)
GFR calc non Af Amer: >90 mL/min (ref >90)
Glucose, Bld: 86 mg/dL (ref 70–99)
Potassium: 4.5 mEq/L (ref 3.7–5.3)
Sodium: 140 mEq/L (ref 137–147)
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
## 2014-01-25 NOTE — Progress Notes (Signed)
    Subjective:  Patient reports pain as mild  Objective:   VITALS:   Filed Vitals:   01/25/14 0212 01/25/14 0400 01/25/14 0633 01/25/14 0735  BP: 100/52  114/67   Pulse: 66  79   Temp: 98.1 F (36.7 C)  98.5 F (36.9 C)   TempSrc: Oral  Oral   Resp: 14 8 13 15   Height:      Weight:      SpO2: 99% 100% 100% 100%    Physical Exam  Dressing: C/D/I  Compartments soft  SILT DP/SP/S/S/T, 2+DP, +TA/GS/EHL  LABS  Results for orders placed during the hospital encounter of 01/22/14 (from the past 24 hour(s))  CBC     Status: Abnormal   Collection Time    01/24/14  8:41 PM      Result Value Ref Range   WBC 12.5 (*) 4.0 - 10.5 K/uL   RBC 3.98 (*) 4.22 - 5.81 MIL/uL   Hemoglobin 12.3 (*) 13.0 - 17.0 g/dL   HCT 60.436.5 (*) 54.039.0 - 98.152.0 %   MCV 91.7  78.0 - 100.0 fL   MCH 30.9  26.0 - 34.0 pg   MCHC 33.7  30.0 - 36.0 g/dL   RDW 19.112.6  47.811.5 - 29.515.5 %   Platelets 164  150 - 400 K/uL  CREATININE, SERUM     Status: None   Collection Time    01/24/14  8:41 PM      Result Value Ref Range   Creatinine, Ser 0.73  0.50 - 1.35 mg/dL   GFR calc non Af Amer >90  >90 mL/min   GFR calc Af Amer >90  >90 mL/min  BASIC METABOLIC PANEL     Status: Abnormal   Collection Time    01/25/14  4:50 AM      Result Value Ref Range   Sodium 140  137 - 147 mEq/L   Potassium 4.5  3.7 - 5.3 mEq/L   Chloride 102  96 - 112 mEq/L   CO2 30  19 - 32 mEq/L   Glucose, Bld 86  70 - 99 mg/dL   BUN 6  6 - 23 mg/dL   Creatinine, Ser 6.210.73  0.50 - 1.35 mg/dL   Calcium 8.3 (*) 8.4 - 10.5 mg/dL   GFR calc non Af Amer >90  >90 mL/min   GFR calc Af Amer >90  >90 mL/min  CBC     Status: Abnormal   Collection Time    01/25/14  4:50 AM      Result Value Ref Range   WBC 9.4  4.0 - 10.5 K/uL   RBC 4.16 (*) 4.22 - 5.81 MIL/uL   Hemoglobin 12.7 (*) 13.0 - 17.0 g/dL   HCT 30.838.4 (*) 65.739.0 - 84.652.0 %   MCV 92.3  78.0 - 100.0 fL   MCH 30.5  26.0 - 34.0 pg   MCHC 33.1  30.0 - 36.0 g/dL   RDW 96.212.5  95.211.5 - 84.115.5 %   Platelets  169  150 - 400 K/uL     Assessment/Plan: 1 Day Post-Op   Active Problems:   Left acetabular fracture   Fracture acetabulum-closed   PLAN: Weight Bearing: TDWB LLE Dressings: Keep C/D/I VTE prophylaxis: Lovenox 40Qday, SCD's Dispo: likely [place]   MURPHY, TIMOTHY, D 01/25/2014, 9:05 AM   Margarita Ranaimothy Murphy, MD Cell [phone]
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
## 2014-01-25 NOTE — Transfer of Care (Signed)
Immediate Anesthesia Transfer of Care Note  Patient: Donald Medina  Procedure(s) Performed: Procedure(s): OPEN REDUCTION INTERNAL FIXATION (ORIF) ACETABULAR FRACTURE (Left)  Patient Location: PACU  Anesthesia Type:General  Level of Consciousness: sedated  Airway & Oxygen Therapy: Patient Spontanous Breathing  Post-op Assessment: Report given to PACU RN and Post -op Vital signs reviewed and stable  Post vital signs: Reviewed and stable  Complications: No apparent anesthesia complications

## 2014-01-25 NOTE — Progress Notes (Signed)
Orthopedic Tech Progress Note Patient Details:  Donald MorganDevon Medina Nov 30, 1975 [id number] Delivered for use with PT Ortho Devices Type of Ortho Device: Crutches Ortho Device/Splint Interventions: Ordered   [place] R Thompson 01/25/2014, 10:32 AM

## 2014-01-26 LAB — BASIC METABOLIC PANEL
BUN: 7 mg/dL (ref 6–23)
CO2: 29 mEq/L (ref 19–32)
CREATININE: 0.73 mg/dL (ref 0.50–1.35)
Calcium: 8.5 mg/dL (ref 8.4–10.5)
Chloride: 101 mEq/L (ref 96–112)
Glucose, Bld: 107 mg/dL — ABNORMAL HIGH (ref 70–99)
POTASSIUM: 3.9 meq/L (ref 3.7–5.3)
Sodium: 141 mEq/L (ref 137–147)

## 2014-01-26 LAB — CBC
HCT: 36.2 % — ABNORMAL LOW (ref 39.0–52.0)
Hemoglobin: 12.1 g/dL — ABNORMAL LOW (ref 13.0–17.0)
MCH: 30.6 pg (ref 26.0–34.0)
MCHC: 33.4 g/dL (ref 30.0–36.0)
MCV: 91.6 fL (ref 78.0–100.0)
Platelets: 170 10*3/uL (ref 150–400)
RBC: 3.95 MIL/uL — ABNORMAL LOW (ref 4.22–5.81)
RDW: 12.3 % (ref 11.5–15.5)
WBC: 8.8 10*3/uL (ref 4.0–10.5)
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
## 2014-01-26 NOTE — Progress Notes (Signed)
     Subjective:  Patient reports pain as moderate.  He feels ready to get rid of the PCA. He does not however feel very mobile, and is frustrated because of the weakness and pain in his hip. He also feels constipated.  Objective:   VITALS:   Filed Vitals:   01/25/14 1855 01/25/14 2030 01/26/14 0145 01/26/14 0559  BP:  128/81 122/71 120/70  Pulse:  107 79 70  Temp:  98.7 F (37.1 C) 98.6 F (37 C) 98.5 F (36.9 C)  TempSrc:  Axillary Oral Oral  Resp: 18 18 20 20   Height:      Weight:      SpO2:  99% 97% 99%    Neurologically intact Sensation intact distally Dorsiflexion/Plantar flexion intact Incision: dressing C/D/I and no drainage Abdomen is soft.  Lab Results  Component Value Date   WBC 8.8 01/26/2014   HGB 12.1* 01/26/2014   HCT 36.2* 01/26/2014   MCV 91.6 01/26/2014   PLT 170 01/26/2014     Assessment/Plan: 2 Days Post-Op   Active Problems:   Left acetabular fracture   Fracture acetabulum-closed  Overall doing well, and discontinue PCA today, physical therapy, nonweightbearing left lower extremity, posterior hip precautions. Dr. handy to resume care tomorrow.   Naama Sappington P 01/26/2014, 8:39 AM   Teryl LucyJoshua Theophil Thivierge, MD Cell [phone]
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
## 2014-01-27 LAB — BASIC METABOLIC PANEL
BUN: 8 mg/dL (ref 6–23)
CO2: 28 mEq/L (ref 19–32)
Calcium: 9.4 mg/dL (ref 8.4–10.5)
Chloride: 101 mEq/L (ref 96–112)
Creatinine, Ser: 0.71 mg/dL (ref 0.50–1.35)
Glucose, Bld: 107 mg/dL — ABNORMAL HIGH (ref 70–99)
POTASSIUM: 4.7 meq/L (ref 3.7–5.3)
SODIUM: 140 meq/L (ref 137–147)

## 2014-01-27 LAB — CBC
HCT: 37.6 % — ABNORMAL LOW (ref 39.0–52.0)
HEMOGLOBIN: 12.6 g/dL — AB (ref 13.0–17.0)
MCH: 30.4 pg (ref 26.0–34.0)
MCHC: 33.5 g/dL (ref 30.0–36.0)
MCV: 90.6 fL (ref 78.0–100.0)
Platelets: 200 10*3/uL (ref 150–400)
RBC: 4.15 MIL/uL — AB (ref 4.22–5.81)
RDW: 12.3 % (ref 11.5–15.5)
WBC: 10.1 10*3/uL (ref 4.0–10.5)

## 2014-01-27 LAB — URINALYSIS, ROUTINE W REFLEX MICROSCOPIC
BILIRUBIN URINE: NEGATIVE
Glucose, UA: NEGATIVE mg/dL
Hgb urine dipstick: NEGATIVE
Ketones, ur: NEGATIVE mg/dL
Leukocytes, UA: NEGATIVE
NITRITE: NEGATIVE
Protein, ur: NEGATIVE mg/dL
SPECIFIC GRAVITY, URINE: 1.018 (ref 1.005–1.030)
UROBILINOGEN UA: 1 mg/dL (ref 0.0–1.0)
pH: 7 (ref 5.0–8.0)

## 2014-01-27 MED ORDER — OXYCODONE HCL 5 MG PO TABS
5.0000 mg | ORAL_TABLET | Freq: Four times a day (QID) | ORAL | Status: DC | PRN
  Administered 2014-01-27 – 2014-01-28 (×4): 10 mg via ORAL
  Filled 2014-01-27: qty 1
  Filled 2014-01-27 (×4): qty 2

## 2014-01-27 MED ORDER — OXYCODONE-ACETAMINOPHEN 5-325 MG PO TABS
1.0000 | ORAL_TABLET | Freq: Four times a day (QID) | ORAL | Status: DC | PRN
  Administered 2014-01-27 – 2014-01-28 (×5): 2 via ORAL
  Filled 2014-01-27: qty 2
  Filled 2014-01-27: qty 1
  Filled 2014-01-27 (×4): qty 2

## 2014-01-27 MED ORDER — METHOCARBAMOL 500 MG PO TABS
1000.0000 mg | ORAL_TABLET | Freq: Four times a day (QID) | ORAL | Status: DC
  Administered 2014-01-27 – 2014-01-28 (×4): 1000 mg via ORAL
  Filled 2014-01-27 (×8): qty 2

## 2014-01-27 MED ORDER — OXYCODONE HCL 5 MG PO TABS
5.0000 mg | ORAL_TABLET | ORAL | Status: DC | PRN
  Administered 2014-01-27 (×2): 10 mg via ORAL
  Filled 2014-01-27 (×2): qty 2

## 2014-01-27 NOTE — Anesthesia Postprocedure Evaluation (Signed)
  Anesthesia Post-op Note  Patient: Donald Medina  Procedure(s) Performed: Procedure(s): OPEN REDUCTION INTERNAL FIXATION (ORIF) ACETABULAR FRACTURE (Left)  Patient Location: PACU  Anesthesia Type:General  Level of Consciousness: awake, alert , oriented and patient cooperative  Airway and Oxygen Therapy: Patient Spontanous Breathing  Post-op Pain: moderate  Post-op Assessment: Post-op Vital signs reviewed, Patient's Cardiovascular Status Stable, Respiratory Function Stable, Patent Airway and No signs of Nausea or vomiting  Post-op Vital Signs: stable  Last Vitals:  Filed Vitals:   01/27/14 0616  BP: 106/66  Pulse: 69  Temp: 36.7 C  Resp: 18    Complications: No apparent anesthesia complications

## 2014-01-27 NOTE — Progress Notes (Signed)
Utilization review completed.  
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
## 2014-01-27 NOTE — Progress Notes (Signed)
I have read and agree with this note.   Cathy Leonard, OTR/L [phone]     
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
## 2014-01-27 NOTE — Progress Notes (Signed)
OT Cancellation Note  Patient Details Name: Donald Medina MRN: [id number] DOB: 10-Sep-1975   Cancelled Treatment:    Reason Eval/Treat Not Completed: OT screened, pt feels confident with ADLs and no needs identified, will sign off.  Maurene CapesKeene, Donald Medina [phone] 01/27/2014, 10:28 AM
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
## 2014-01-28 ENCOUNTER — Encounter (HOSPITAL_COMMUNITY): Payer: Self-pay | Admitting: Orthopedic Surgery

## 2014-01-28 MED ORDER — ENOXAPARIN SODIUM 40 MG/0.4ML ~~LOC~~ SOLN: 40.0000 mg | SUBCUTANEOUS | Status: AC

## 2014-01-28 MED ORDER — POLYETHYLENE GLYCOL 3350 17 G PO PACK: 17.0000 g | PACK | Freq: Every day | ORAL | Status: AC

## 2014-01-28 MED ORDER — OXYCODONE HCL 5 MG PO TABS: 5.0000 mg | ORAL_TABLET | ORAL | Status: AC | PRN

## 2014-01-28 MED ORDER — METHOCARBAMOL 500 MG PO TABS: 500.0000 mg | ORAL_TABLET | Freq: Four times a day (QID) | ORAL | Status: AC

## 2014-01-28 MED ORDER — OXYCODONE-ACETAMINOPHEN 10-325 MG PO TABS: 1.0000 | ORAL_TABLET | Freq: Four times a day (QID) | ORAL | Status: AC | PRN

## 2014-01-28 MED ORDER — DSS 100 MG PO CAPS: 100.0000 mg | ORAL_CAPSULE | Freq: Two times a day (BID) | ORAL | Status: AC

## 2014-01-28 MED FILL — Sodium Chloride IV Soln 0.9%: INTRAVENOUS | Qty: 1000 | Status: AC

## 2014-01-28 MED FILL — Heparin Sodium (Porcine) Inj 1000 Unit/ML: INTRAMUSCULAR | Qty: 30 | Status: AC
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
## 2014-01-28 NOTE — Discharge Instructions (Signed)
[facility] Discharge Instructions   General Discharge Instructions  WEIGHT BEARING STATUS: Touchdown weightbearing left leg  RANGE OF MOTION/ACTIVITY: Posterior hip precautions left hip. Knee and ankle range of motion as tolerated Activity as tolerated while maintaining weight bearing and range of motion restrictions   Diet: as you were eating previously.  Can use over the counter stool softeners and bowel preparations, such as Miralax, to help with bowel movements.  Narcotics can be constipating.  Be sure to drink plenty of fluids  STOP SMOKING OR USING NICOTINE PRODUCTS!!!!  As discussed nicotine severely impairs your body's ability to heal surgical and traumatic wounds but also impairs bone healing.  Wounds and bone heal by forming microscopic blood vessels (angiogenesis) and nicotine is a vasoconstrictor (essentially, shrinks blood vessels).  Therefore, if vasoconstriction occurs to these microscopic blood vessels they essentially disappear and are unable to deliver necessary nutrients to the healing tissue.  This is one modifiable factor that you can do to dramatically increase your chances of healing your injury.    (This means no smoking, no nicotine gum, patches, etc)  DO NOT USE NONSTEROIDAL ANTI-INFLAMMATORY DRUGS (NSAID'S)  Using products such as Advil (ibuprofen), Aleve (naproxen), Motrin (ibuprofen) for additional pain control during fracture healing can delay and/or prevent the healing response.  If you would like to take over the counter (OTC) medication, Tylenol (acetaminophen) is ok.  However, some narcotic medications that are given for pain control contain acetaminophen as well. Therefore, you should not exceed more than 4000 mg of tylenol in a day if you do not have liver disease.  Also note that there are may OTC medicines, such as cold medicines and allergy medicines that my contain tylenol as well.  If you have any questions about medications and/or  interactions please ask your doctor/PA or your pharmacist.   PAIN MEDICATION USE AND EXPECTATIONS  You have likely been given narcotic medications to help control your pain.  After a traumatic event that results in an fracture (broken bone) with or without surgery, it is ok to use narcotic pain medications to help control one's pain.  We understand that everyone responds to pain differently and each individual patient will be evaluated on a regular basis for the continued need for narcotic medications. Ideally, narcotic medication use should last no more than 6-8 weeks (coinciding with fracture healing).   As a patient it is your responsibility as well to monitor narcotic medication use and report the amount and frequency you use these medications when you come to your office visit.   We would also advise that if you are using narcotic medications, you should take a dose prior to therapy to maximize you participation.  IF YOU ARE ON NARCOTIC MEDICATIONS IT IS NOT PERMISSIBLE TO OPERATE A MOTOR VEHICLE (MOTORCYCLE/CAR/TRUCK/MOPED) OR HEAVY MACHINERY DO NOT MIX NARCOTICS WITH OTHER CNS (CENTRAL NERVOUS SYSTEM) DEPRESSANTS SUCH AS ALCOHOL       ICE AND ELEVATE INJURED/OPERATIVE EXTREMITY  Using ice and elevating the injured extremity above your heart can help with swelling and pain control.  Icing in a pulsatile fashion, such as 20 minutes on and 20 minutes off, can be followed.    Do not place ice directly on skin. Make sure there is a barrier between to skin and the ice pack.    Using frozen items such as frozen peas works well as the conform nicely to the are that needs to be iced.  USE AN ACE WRAP OR TED HOSE FOR  SWELLING CONTROL  In addition to icing and elevation, Ace wraps or TED hose are used to help limit and resolve swelling.  It is recommended to use Ace wraps or TED hose until you are informed to stop.    When using Ace Wraps start the wrapping distally (farthest away from the body) and  wrap proximally (closer to the body)   Example: If you had surgery on your leg or thing and you do not have a splint on, start the ace wrap at the toes and work your way up to the thigh        If you had surgery on your upper extremity and do not have a splint on, start the ace wrap at your fingers and work your way up to the upper arm  IF YOU ARE IN A SPLINT OR CAST DO NOT REMOVE IT FOR ANY REASON   If your splint gets wet for any reason please contact the office immediately. You may shower in your splint or cast as long as you keep it dry.  This can be done by wrapping in a cast cover or garbage back (or similar)  Do Not stick any thing down your splint or cast such as pencils, money, or hangers to try and scratch yourself with.  If you feel itchy take benadryl as prescribed on the bottle for itching  IF YOU ARE IN A CAM BOOT (BLACK BOOT)  You may remove boot periodically. Perform daily dressing changes as noted below.  Wash the liner of the boot regularly and wear a sock when wearing the boot. It is recommended that you sleep in the boot until told otherwise  CALL THE OFFICE WITH ANY QUESTIONS OR CONCERTS: [phone]     Discharge Pin Site Instructions  Dress pins daily with Kerlix roll starting on POD 2. Wrap the Kerlix so that it tamps the skin down around the pin-skin interface to prevent/limit motion of the skin relative to the pin.  (Pin-skin motion is the primary cause of pain and infection related to external fixator pin sites).  Remove any crust or coagulum that may obstruct drainage with a saline moistened gauze or soap and water.  After POD 3, if there is no discernable drainage on the pin site dressing, the interval for change can by increased to every other day.  You may shower with the fixator, cleaning all pin sites gently with soap and water.  If you have a surgical wound this needs to be completely dry and without drainage before showering.  The extremity can be lifted  by the fixator to facilitate wound care and transfers.  Notify the office/Doctor if you experience increasing drainage, redness, or pain from a pin site, or if you notice purulent (thick, snot-like) drainage.  Discharge Wound Care Instructions  Do NOT apply any ointments, solutions or lotions to pin sites or surgical wounds.  These prevent needed drainage and even though solutions like hydrogen peroxide kill bacteria, they also damage cells lining the pin sites that help fight infection.  Applying lotions or ointments can keep the wounds moist and can cause them to breakdown and open up as well. This can increase the risk for infection. When in doubt call the office.  Surgical incisions should be dressed daily.  If any drainage is noted, use one layer of adaptic, then gauze, Kerlix, and an ace wrap.  Once the incision is completely dry and without drainage, it may be left open to air out.  Showering  may begin 36-48 hours later.  Cleaning gently with soap and water.  Traumatic wounds should be dressed daily as well.    One layer of adaptic, gauze, Kerlix, then ace wrap.  The adaptic can be discontinued once the draining has ceased    If you have a wet to dry dressing: wet the gauze with saline the squeeze as much saline out so the gauze is moist (not soaking wet), place moistened gauze over wound, then place a dry gauze over the moist one, followed by Kerlix wrap, then ace wrap.
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
## 2016-02-12 IMAGING — CR DG PELVIS 3+V JUDET
4 series · 4 of 4 positions shown · non-contrast
Comparison: 01/23/2014, 01/24/2014

CLINICAL DATA: Postoperative films after fixation of acetabular
fracture on the left

EXAM:
JUDET PELVIS - 3+ VIEW

[towne view (1 of 4)]
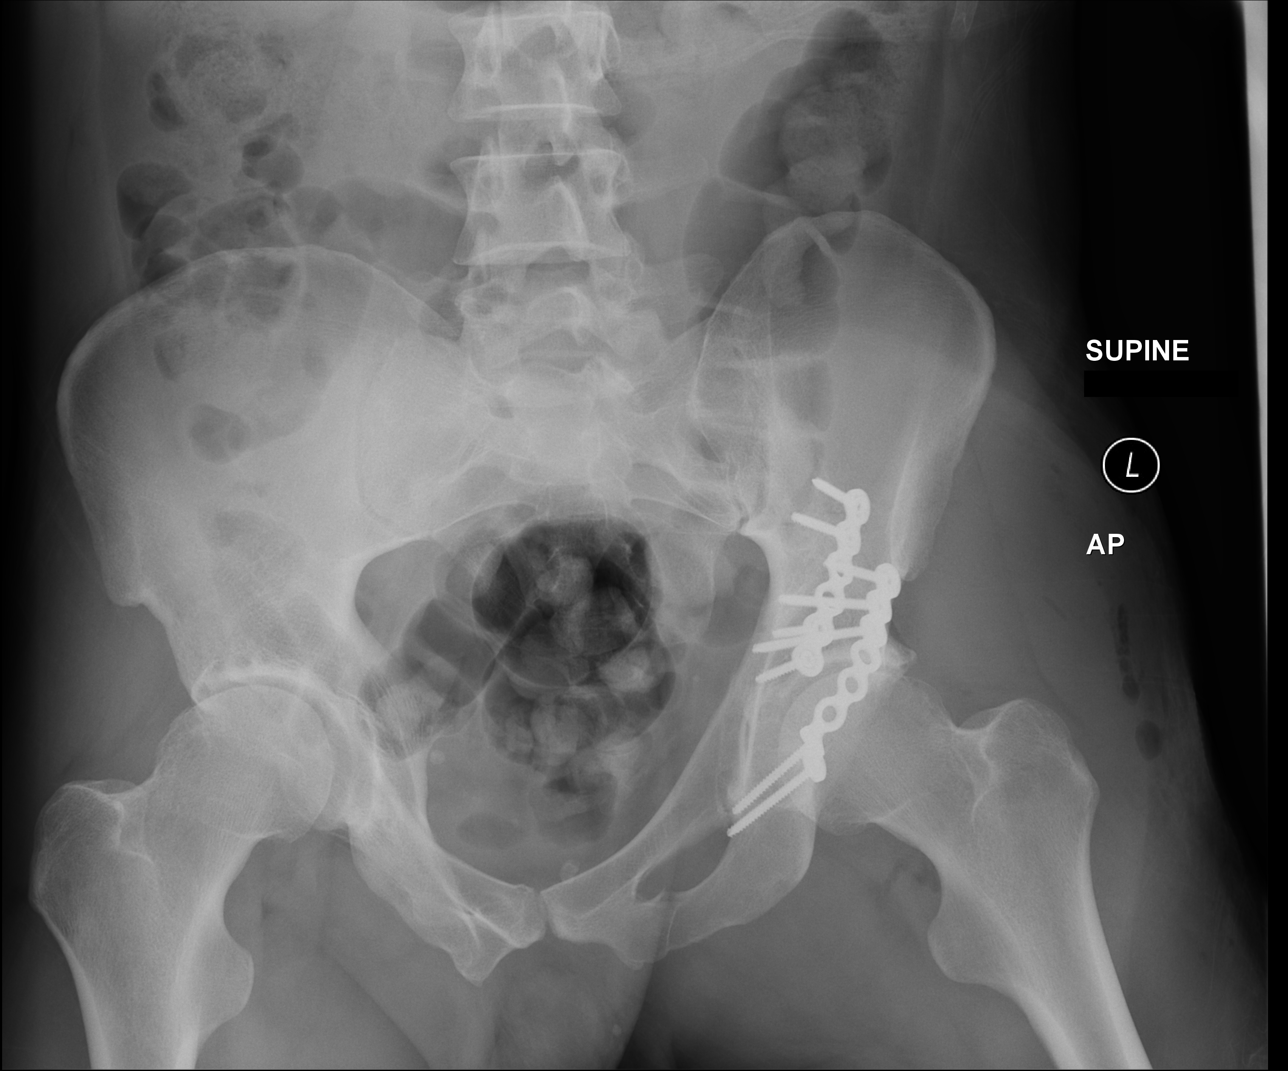

[towne view (2 of 4)]
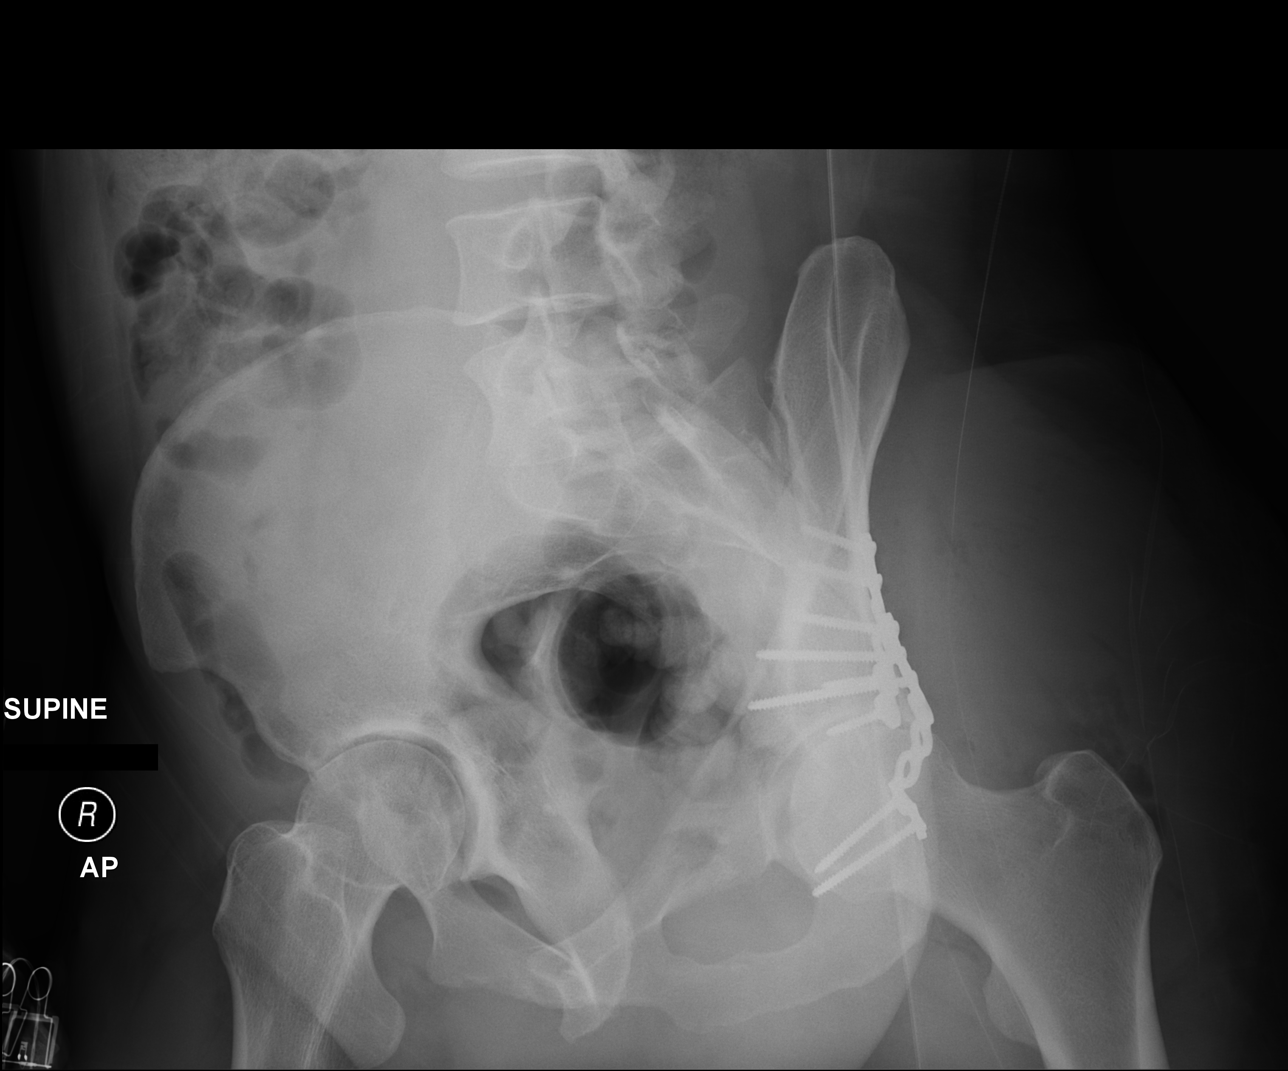

[towne view (3 of 4)]
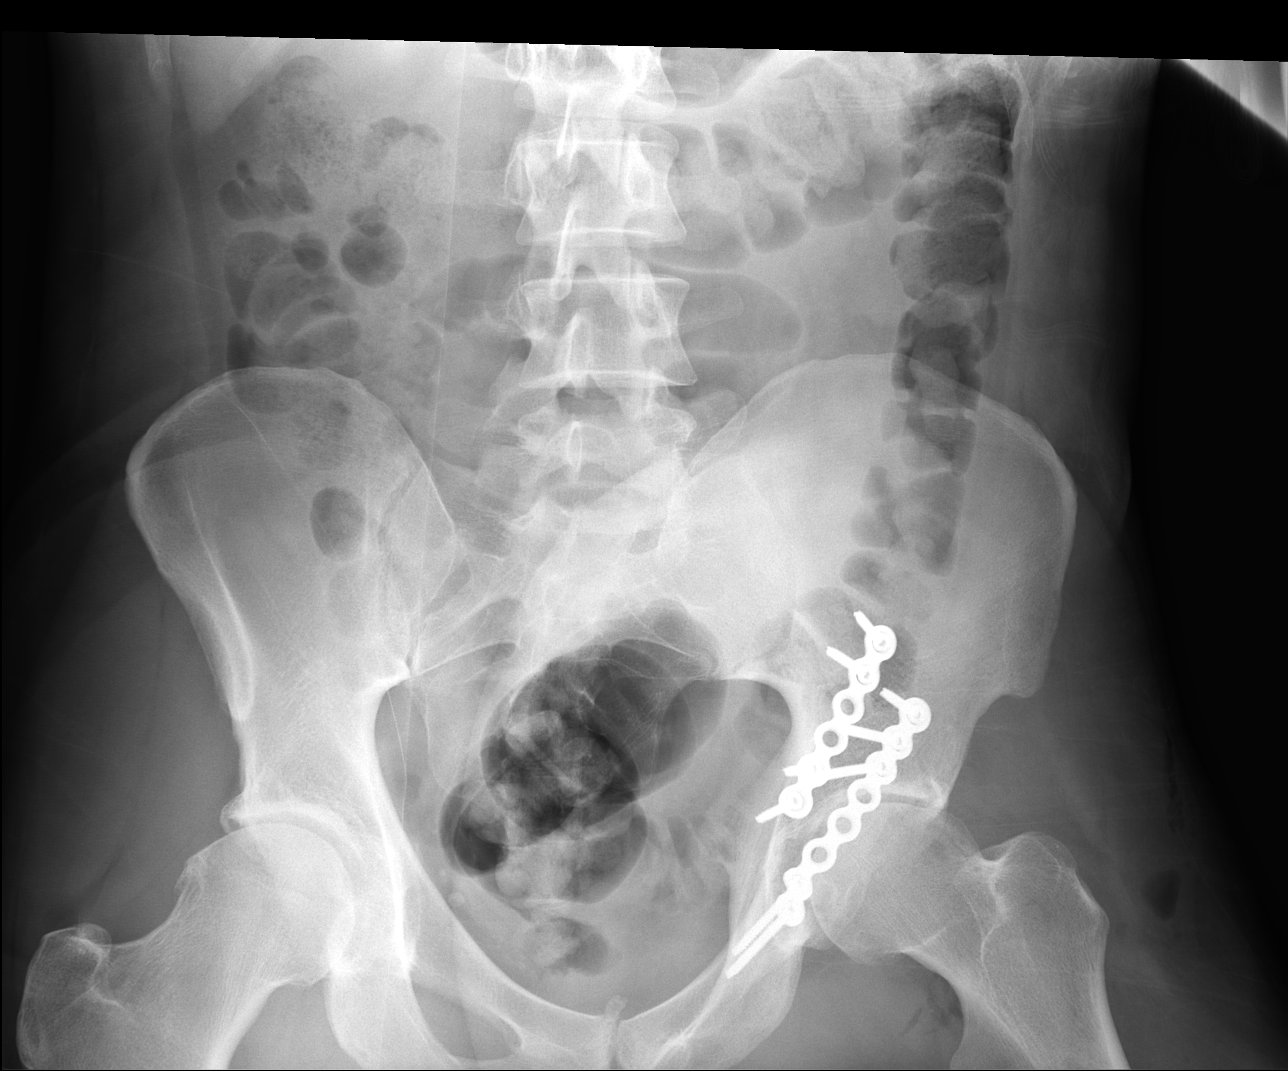

[towne view (4 of 4)]
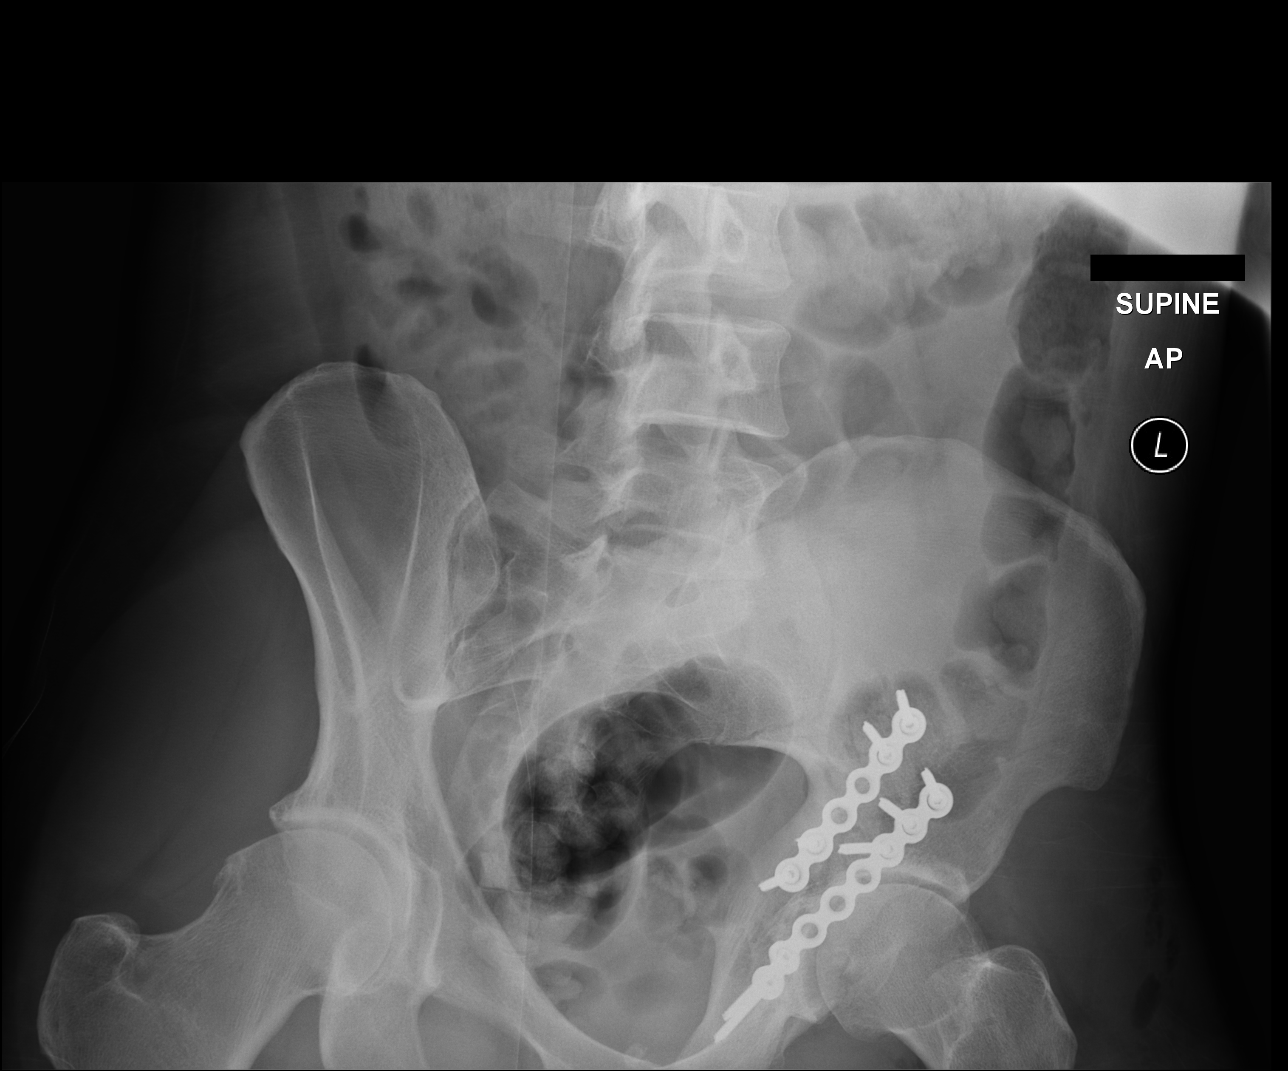

[4 of 4 positions shown; findings below may reference images not displayed]

FINDINGS: Moderate narrowing of both hip joints. Surgical fixation of left
acetabular fracture identified. Fracture fragments are in anatomic
position and alignment. Nondisplaced fracture left inferior pubic
ramus unchanged.
IMPRESSION: Orthopedic fixation of left acetabular fracture
